# Patient Record
Sex: Male | Born: 1985 | Hispanic: Yes | Marital: Married
Health system: Southern US, Community
[De-identification: ages and names within clinical notes are randomized; demographics above are authoritative.]

---

## 2015-05-16 ENCOUNTER — Emergency Department (HOSPITAL_COMMUNITY)
Admission: EM | Admit: 2015-05-16 | Discharge: 2015-05-16 | Disposition: A | Discharge status: Home / Self Care | Payer: No Typology Code available for payment source | Attending: Emergency Medicine | Admitting: Emergency Medicine

## 2015-05-16 ENCOUNTER — Emergency Department (HOSPITAL_COMMUNITY): Payer: No Typology Code available for payment source

## 2015-05-16 ENCOUNTER — Encounter (HOSPITAL_COMMUNITY): Payer: Self-pay | Admitting: *Deleted

## 2015-05-16 DIAGNOSIS — Y92410 Unspecified street and highway as the place of occurrence of the external cause: Secondary | ICD-10-CM | POA: Insufficient documentation

## 2015-05-16 DIAGNOSIS — S301XXA Contusion of abdominal wall, initial encounter: Secondary | ICD-10-CM | POA: Diagnosis not present

## 2015-05-16 DIAGNOSIS — S161XXA Strain of muscle, fascia and tendon at neck level, initial encounter: Secondary | ICD-10-CM

## 2015-05-16 DIAGNOSIS — Y9389 Activity, other specified: Secondary | ICD-10-CM | POA: Insufficient documentation

## 2015-05-16 DIAGNOSIS — S79912A Unspecified injury of left hip, initial encounter: Secondary | ICD-10-CM | POA: Diagnosis not present

## 2015-05-16 DIAGNOSIS — S4992XA Unspecified injury of left shoulder and upper arm, initial encounter: Secondary | ICD-10-CM | POA: Insufficient documentation

## 2015-05-16 DIAGNOSIS — S3992XA Unspecified injury of lower back, initial encounter: Secondary | ICD-10-CM | POA: Diagnosis not present

## 2015-05-16 DIAGNOSIS — S20211A Contusion of right front wall of thorax, initial encounter: Secondary | ICD-10-CM | POA: Insufficient documentation

## 2015-05-16 DIAGNOSIS — Y998 Other external cause status: Secondary | ICD-10-CM | POA: Insufficient documentation

## 2015-05-16 DIAGNOSIS — S299XXA Unspecified injury of thorax, initial encounter: Secondary | ICD-10-CM | POA: Diagnosis present

## 2015-05-16 DIAGNOSIS — S20219A Contusion of unspecified front wall of thorax, initial encounter: Secondary | ICD-10-CM

## 2015-05-16 LAB — CBC WITH DIFFERENTIAL/PLATELET
BASOS ABS: 0 10*3/uL (ref 0.0–0.1)
BASOS PCT: 0 %
EOS ABS: 0.6 10*3/uL (ref 0.0–0.7)
Eosinophils Relative: 4 %
HEMATOCRIT: 41.1 % (ref 39.0–52.0)
HEMOGLOBIN: 13.8 g/dL (ref 13.0–17.0)
Lymphocytes Relative: 13 %
Lymphs Abs: 1.9 10*3/uL (ref 0.7–4.0)
MCH: 27.3 pg (ref 26.0–34.0)
MCHC: 33.6 g/dL (ref 30.0–36.0)
MCV: 81.4 fL (ref 78.0–100.0)
MONO ABS: 0.9 10*3/uL (ref 0.1–1.0)
Monocytes Relative: 6 %
NEUTROS ABS: 11.7 10*3/uL — AB (ref 1.7–7.7)
NEUTROS PCT: 77 %
Platelets: 228 10*3/uL (ref 150–400)
RBC: 5.05 MIL/uL (ref 4.22–5.81)
RDW: 13.1 % (ref 11.5–15.5)
WBC: 15.1 10*3/uL — ABNORMAL HIGH (ref 4.0–10.5)

## 2015-05-16 LAB — BASIC METABOLIC PANEL
ANION GAP: 10 (ref 5–15)
BUN: 12 mg/dL (ref 6–20)
CALCIUM: 9.1 mg/dL (ref 8.9–10.3)
CO2: 23 mmol/L (ref 22–32)
CREATININE: 1.21 mg/dL (ref 0.61–1.24)
Chloride: 103 mmol/L (ref 101–111)
Glucose, Bld: 135 mg/dL — ABNORMAL HIGH (ref 65–99)
Potassium: 3.1 mmol/L — ABNORMAL LOW (ref 3.5–5.1)
Sodium: 136 mmol/L (ref 135–145)

## 2015-05-16 MED ORDER — IOHEXOL 300 MG/ML  SOLN
100.0000 mL | Freq: Once | INTRAMUSCULAR | Status: AC | PRN
  Administered 2015-05-16: 100 mL via INTRAVENOUS

## 2015-05-16 MED ORDER — SODIUM CHLORIDE 0.9 % IV BOLUS (SEPSIS)
1000.0000 mL | Freq: Once | INTRAVENOUS | Status: AC
  Administered 2015-05-16: 1000 mL via INTRAVENOUS

## 2015-05-16 MED ORDER — ONDANSETRON HCL 4 MG/2ML IJ SOLN
4.0000 mg | Freq: Once | INTRAMUSCULAR | Status: AC
  Administered 2015-05-16: 4 mg via INTRAVENOUS
  Filled 2015-05-16: qty 2

## 2015-05-16 MED ORDER — MORPHINE SULFATE (PF) 4 MG/ML IV SOLN
4.0000 mg | Freq: Once | INTRAVENOUS | Status: AC
  Administered 2015-05-16: 4 mg via INTRAVENOUS
  Filled 2015-05-16: qty 1

## 2015-05-16 MED ORDER — TRAMADOL HCL 50 MG PO TABS: 50.0000 mg | ORAL_TABLET | Freq: Four times a day (QID) | ORAL | Status: AC | PRN
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
